# Patient Record
Sex: Female | Born: 1955 | Race: White | Hispanic: No | Marital: Married
Health system: Southern US, Community
[De-identification: ages and names within clinical notes are randomized; demographics above are authoritative.]

---

## 2001-01-30 ENCOUNTER — Other Ambulatory Visit: Admission: RE | Admit: 2001-01-30 | Discharge: 2001-01-30 | Payer: Self-pay | Admitting: *Deleted

## 2004-07-21 ENCOUNTER — Ambulatory Visit: Payer: Self-pay | Admitting: Family Medicine

## 2011-03-12 ENCOUNTER — Emergency Department: Payer: Self-pay | Admitting: Emergency Medicine

## 2012-09-01 IMAGING — CR NASAL BONES - 3+ VIEW
1 series · 3 of 3 positions shown · non-contrast
Comparison: none

REASON FOR EXAM: pain and swelling after fall/   Flex 8
COMMENTS:   LMP: Post-Menopausal

PROCEDURE:     DXR - DXR NASAL BONES  - March 12, 2011  [DATE]
RESULT:
Bilateral nasal bone fractures are identified left greater than the right.
There is mild depression on the left.

[Series 1: view not recorded · 0.17mm/px · 3 of 3 slices shown]
[im 1/3]
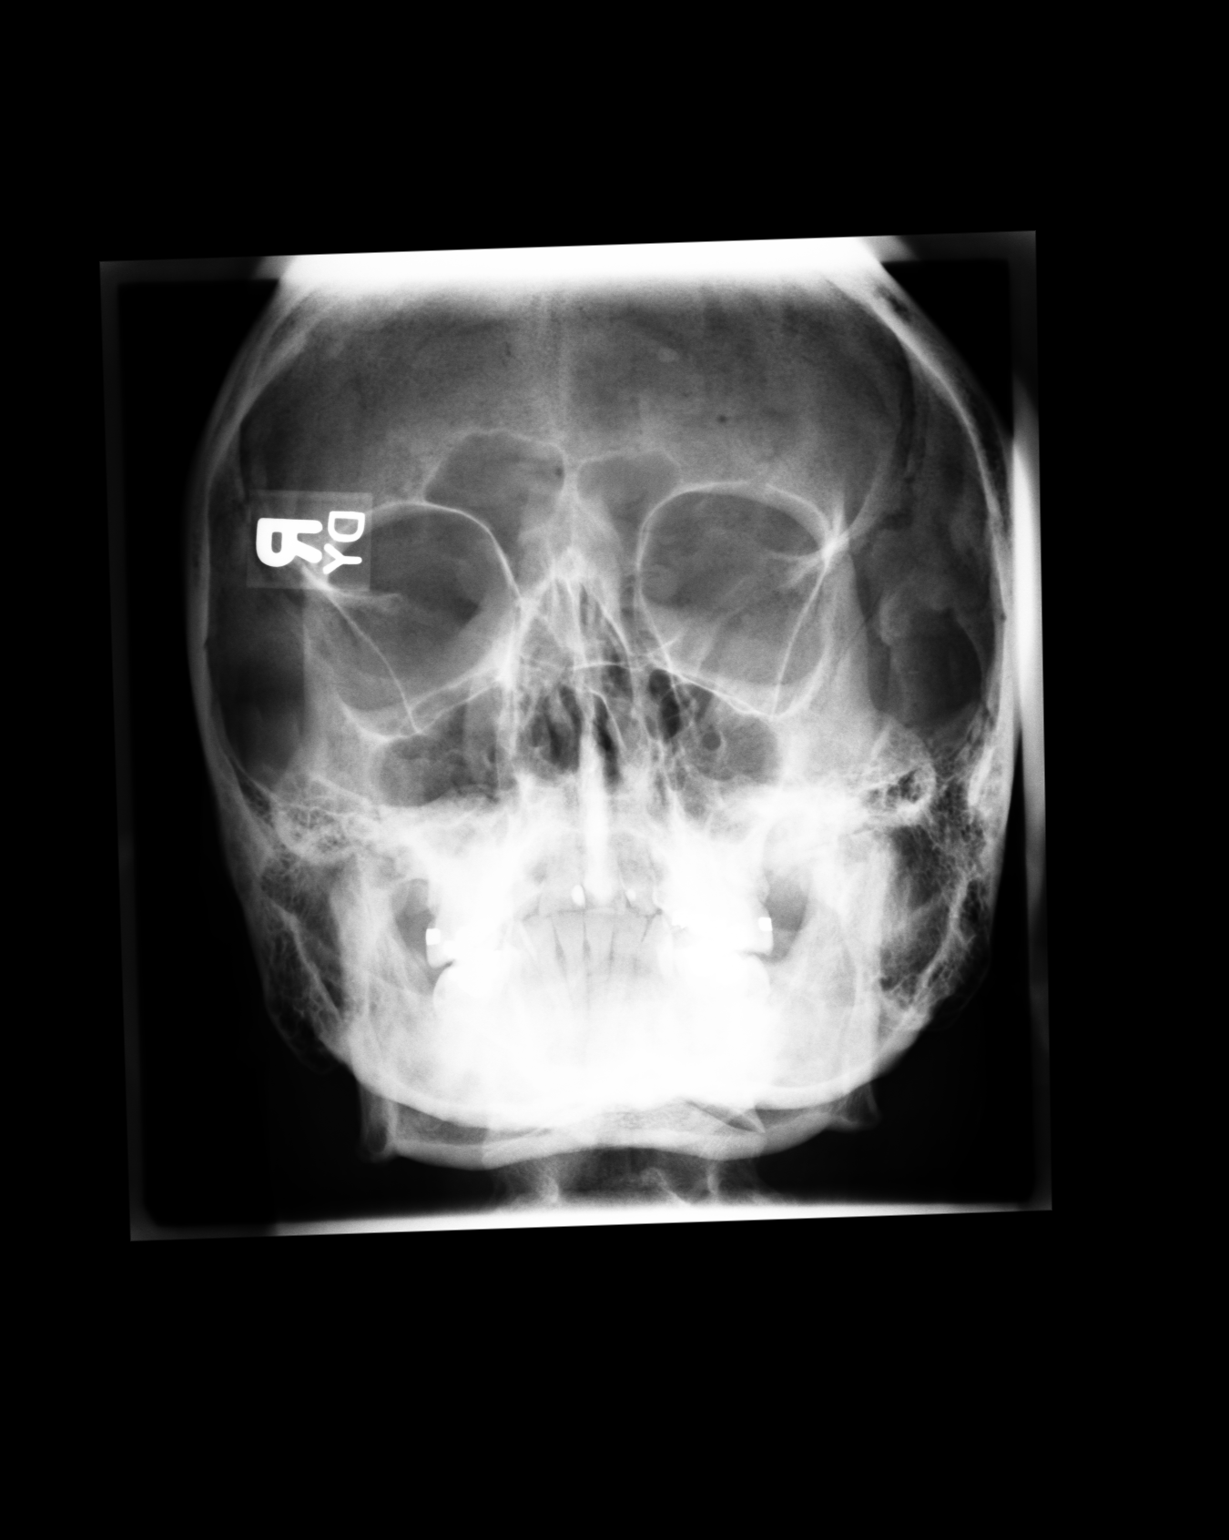
[im 2/3]
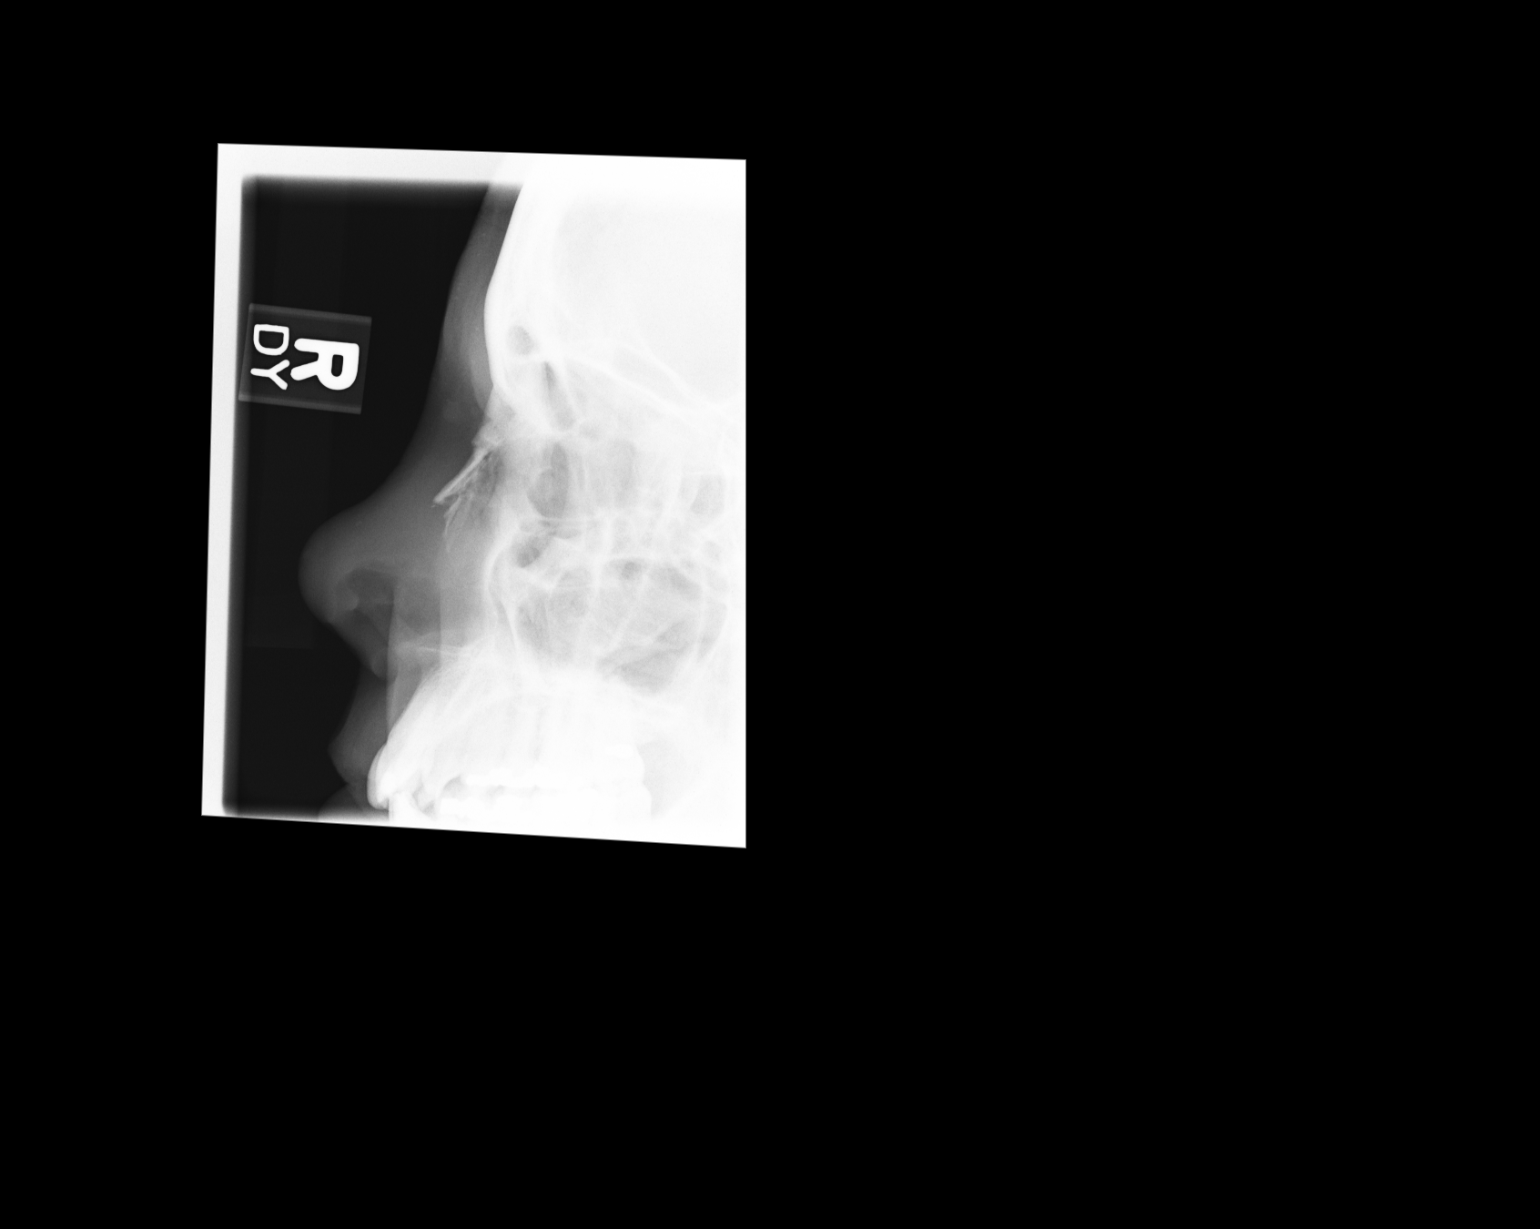
[im 3/3]
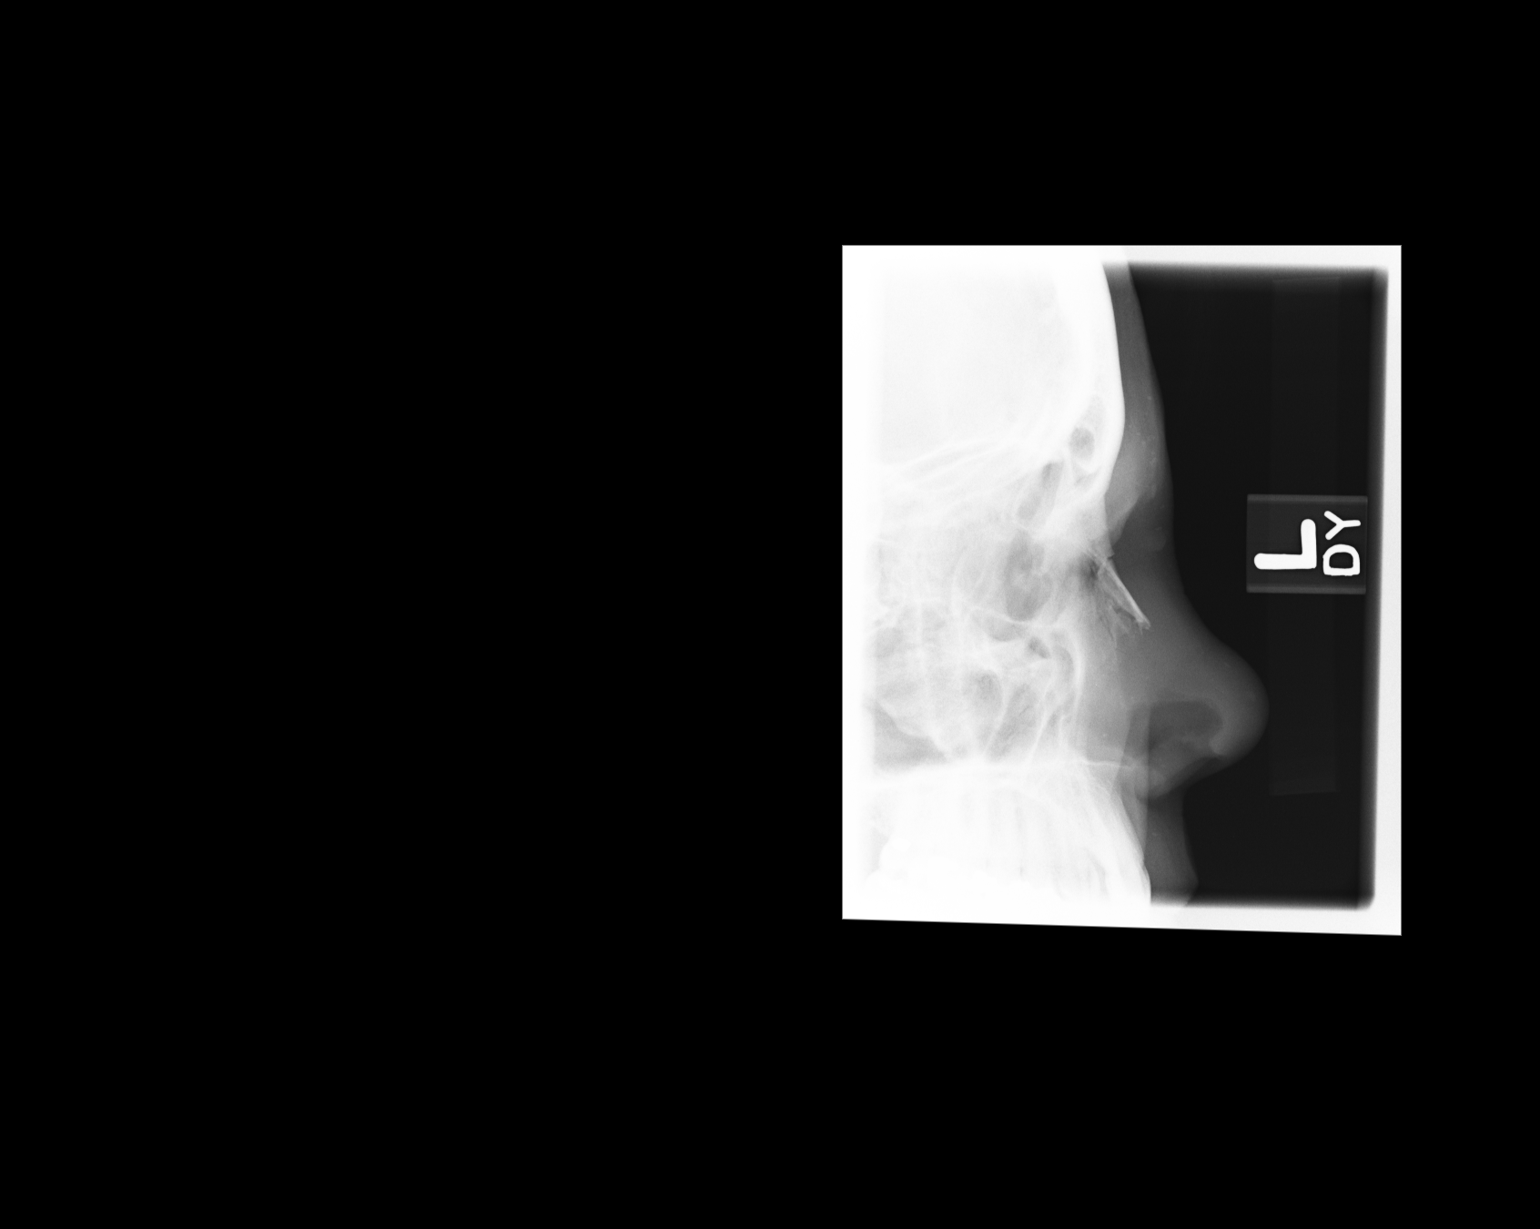

[3 of 3 positions shown; findings below may reference images not displayed]

IMPRESSION: 1. Bilateral nasal bone fractures.

## 2014-06-01 DIAGNOSIS — C4491 Basal cell carcinoma of skin, unspecified: Secondary | ICD-10-CM

## 2014-06-01 HISTORY — DX: Basal cell carcinoma of skin, unspecified: C44.91

## 2014-08-03 ENCOUNTER — Ambulatory Visit: Payer: Self-pay | Admitting: Physician Assistant

## 2014-08-03 LAB — URINALYSIS, COMPLETE
BILIRUBIN, UR: NEGATIVE
Blood: NEGATIVE
Glucose,UR: NEGATIVE
KETONE: NEGATIVE
Nitrite: NEGATIVE
Ph: 5.5 (ref 5.0–8.0)
Protein: NEGATIVE
Specific Gravity: 1.005 (ref 1.000–1.030)

## 2016-10-09 ENCOUNTER — Other Ambulatory Visit: Payer: Self-pay | Admitting: Family Medicine

## 2016-10-09 DIAGNOSIS — D239 Other benign neoplasm of skin, unspecified: Secondary | ICD-10-CM

## 2016-10-09 DIAGNOSIS — Z1231 Encounter for screening mammogram for malignant neoplasm of breast: Secondary | ICD-10-CM

## 2016-10-09 HISTORY — DX: Other benign neoplasm of skin, unspecified: D23.9

## 2017-01-29 ENCOUNTER — Ambulatory Visit
Admission: RE | Admit: 2017-01-29 | Discharge: 2017-01-29 | Disposition: A | Discharge status: Home / Self Care | Payer: BC Managed Care – PPO | Source: Ambulatory Visit | Attending: Family Medicine | Admitting: Family Medicine

## 2017-01-29 ENCOUNTER — Encounter: Payer: Self-pay | Admitting: Radiology

## 2017-01-29 DIAGNOSIS — Z1231 Encounter for screening mammogram for malignant neoplasm of breast: Secondary | ICD-10-CM

## 2020-02-18 ENCOUNTER — Encounter: Payer: BC Managed Care – PPO | Admitting: Dermatology

## 2020-05-31 ENCOUNTER — Encounter: Payer: BC Managed Care – PPO | Admitting: Dermatology

## 2020-09-23 ENCOUNTER — Other Ambulatory Visit: Payer: Self-pay

## 2020-09-23 ENCOUNTER — Encounter: Payer: Self-pay | Admitting: Dermatology

## 2020-09-23 ENCOUNTER — Ambulatory Visit: Payer: BC Managed Care – PPO | Admitting: Dermatology

## 2020-09-23 DIAGNOSIS — L570 Actinic keratosis: Secondary | ICD-10-CM | POA: Diagnosis not present

## 2020-09-23 DIAGNOSIS — L578 Other skin changes due to chronic exposure to nonionizing radiation: Secondary | ICD-10-CM | POA: Diagnosis not present

## 2020-09-23 DIAGNOSIS — D229 Melanocytic nevi, unspecified: Secondary | ICD-10-CM

## 2020-09-23 DIAGNOSIS — Z1283 Encounter for screening for malignant neoplasm of skin: Secondary | ICD-10-CM | POA: Diagnosis not present

## 2020-09-23 DIAGNOSIS — Z86018 Personal history of other benign neoplasm: Secondary | ICD-10-CM | POA: Diagnosis not present

## 2020-09-23 DIAGNOSIS — L918 Other hypertrophic disorders of the skin: Secondary | ICD-10-CM

## 2020-09-23 DIAGNOSIS — L821 Other seborrheic keratosis: Secondary | ICD-10-CM

## 2020-09-23 DIAGNOSIS — L853 Xerosis cutis: Secondary | ICD-10-CM

## 2020-09-23 DIAGNOSIS — D1800 Hemangioma unspecified site: Secondary | ICD-10-CM

## 2020-09-23 DIAGNOSIS — Z85828 Personal history of other malignant neoplasm of skin: Secondary | ICD-10-CM

## 2020-09-23 DIAGNOSIS — L814 Other melanin hyperpigmentation: Secondary | ICD-10-CM

## 2020-09-23 NOTE — Progress Notes (Signed)
Follow-Up Visit   Subjective  Heather Frey is a 65 y.o. female who presents for the following: Annual Exam (Hx BCC, dysplastic nevus, AK's ). The patient presents for Total-Body Skin Exam (TBSE) for skin cancer screening and mole check.  The following portions of the chart were reviewed this encounter and updated as appropriate:   Allergies  Meds  Problems  Med Hx  Surg Hx  Fam Hx     Review of Systems:  No other skin or systemic complaints except as noted in HPI or Assessment and Plan.  Objective  Well appearing patient in no apparent distress; mood and affect are within normal limits.  A full examination was performed including scalp, head, eyes, ears, nose, lips, neck, chest, axillae, abdomen, back, buttocks, bilateral upper extremities, bilateral lower extremities, hands, feet, fingers, toes, fingernails, and toenails. All findings within normal limits unless otherwise noted below.  Objective  L cheek x 1: Erythematous thin papules/macules with gritty scale.   Assessment & Plan  AK (actinic keratosis) L cheek x 1  Destruction of lesion - L cheek x 1 Complexity: simple   Destruction method: cryotherapy   Informed consent: discussed and consent obtained   Timeout:  patient name, date of birth, surgical site, and procedure verified Lesion destroyed using liquid nitrogen: Yes   Region frozen until ice ball extended beyond lesion: Yes   Outcome: patient tolerated procedure well with no complications   Post-procedure details: wound care instructions given    Skin cancer screening   Lentigines - Scattered tan macules - Due to sun exposure - Benign-appering, observe - Recommend daily broad spectrum sunscreen SPF 30+ to sun-exposed areas, reapply every 2 hours as needed. - Call for any changes  Seborrheic Keratoses - Stuck-on, waxy, tan-brown papules and/or plaques  - Benign-appearing - Discussed benign etiology and prognosis. - Observe - Call for any  changes  Melanocytic Nevi - Tan-brown and/or pink-flesh-colored symmetric macules and papules - Benign appearing on exam today - Observation - Call clinic for new or changing moles - Recommend daily use of broad spectrum spf 30+ sunscreen to sun-exposed areas.   Hemangiomas - Red papules - Discussed benign nature - Observe - Call for any changes  Actinic Damage - Chronic condition, secondary to cumulative UV/sun exposure - diffuse scaly erythematous macules with underlying dyspigmentation - Recommend daily broad spectrum sunscreen SPF 30+ to sun-exposed areas, reapply every 2 hours as needed.  - Staying in the shade or wearing long sleeves, sun glasses (UVA+UVB protection) and wide brim hats (4-inch brim around the entire circumference of the hat) are also recommended for sun protection.  - Call for new or changing lesions.  History of Basal Cell Carcinoma of the Skin - L upper lip med to mid nasolabial area - No evidence of recurrence today - Recommend regular full body skin exams - Recommend daily broad spectrum sunscreen SPF 30+ to sun-exposed areas, reapply every 2 hours as needed.  - Call if any new or changing lesions are noted between office visits  History of Dysplastic Nevus - R paraspinal mid to upper back - moderate - No evidence of recurrence today - Recommend regular full body skin exams - Recommend daily broad spectrum sunscreen SPF 30+ to sun-exposed areas, reapply every 2 hours as needed.  - Call if any new or changing lesions are noted between office visits  Xerosis - diffuse xerotic patches - recommend gentle, hydrating skin care - gentle skin care handout given  Acrochordons (Skin Tags) -  Fleshy, skin-colored pedunculated papules - Benign appearing.  - Observe. - If desired, they can be removed with an in office procedure that is not covered by insurance. - Please call the clinic if you notice any new or changing lesions.  Skin cancer screening  performed today.  Return in about 1 year (around 09/23/2021) for TBSE - hx BCC, dysplastic nevus, AK's.  Luther Redo, CMA, am acting as scribe for Sarina Ser, MD .  Documentation: I have reviewed the above documentation for accuracy and completeness, and I agree with the above.  Sarina Ser, MD

## 2020-09-23 NOTE — Patient Instructions (Signed)

## 2020-09-24 ENCOUNTER — Encounter: Payer: Self-pay | Admitting: Dermatology

## 2021-09-26 ENCOUNTER — Ambulatory Visit: Payer: BC Managed Care – PPO | Admitting: Dermatology

## 2021-09-26 DIAGNOSIS — L814 Other melanin hyperpigmentation: Secondary | ICD-10-CM

## 2021-09-26 DIAGNOSIS — D1800 Hemangioma unspecified site: Secondary | ICD-10-CM

## 2021-09-26 DIAGNOSIS — Z85828 Personal history of other malignant neoplasm of skin: Secondary | ICD-10-CM

## 2021-09-26 DIAGNOSIS — L918 Other hypertrophic disorders of the skin: Secondary | ICD-10-CM

## 2021-09-26 DIAGNOSIS — Z86018 Personal history of other benign neoplasm: Secondary | ICD-10-CM | POA: Diagnosis not present

## 2021-09-26 DIAGNOSIS — D229 Melanocytic nevi, unspecified: Secondary | ICD-10-CM

## 2021-09-26 DIAGNOSIS — D692 Other nonthrombocytopenic purpura: Secondary | ICD-10-CM

## 2021-09-26 DIAGNOSIS — Z1283 Encounter for screening for malignant neoplasm of skin: Secondary | ICD-10-CM

## 2021-09-26 DIAGNOSIS — L578 Other skin changes due to chronic exposure to nonionizing radiation: Secondary | ICD-10-CM

## 2021-09-26 DIAGNOSIS — L853 Xerosis cutis: Secondary | ICD-10-CM

## 2021-09-26 DIAGNOSIS — L821 Other seborrheic keratosis: Secondary | ICD-10-CM

## 2021-09-26 NOTE — Progress Notes (Signed)
? ?Follow-Up Visit ?  ?Subjective  ?Rand Heather Frey is a 66 y.o. female who presents for the following: Annual Exam (History of BCC and Dysplastic Nevus - TBSE today). ?The patient presents for Total-Body Skin Exam (TBSE) for skin cancer screening and mole check.  The patient has spots, moles and lesions to be evaluated, some may be new or changing and the patient has concerns that these could be cancer. ? ?The following portions of the chart were reviewed this encounter and updated as appropriate:  ? Allergies  Meds  Problems  Med Hx  Surg Hx  Fam Hx   ?  ?Review of Systems:  No other skin or systemic complaints except as noted in HPI or Assessment and Plan. ? ?Objective  ?Well appearing patient in no apparent distress; mood and affect are within normal limits. ? ?A full examination was performed including scalp, head, eyes, ears, nose, lips, neck, chest, axillae, abdomen, back, buttocks, bilateral upper extremities, bilateral lower extremities, hands, feet, fingers, toes, fingernails, and toenails. All findings within normal limits unless otherwise noted below. ? ? ?Assessment & Plan  ? ?History of Basal Cell Carcinoma of the Skin ?- No evidence of recurrence today ?- Recommend regular full body skin exams ?- Recommend daily broad spectrum sunscreen SPF 30+ to sun-exposed areas, reapply every 2 hours as needed.  ?- Call if any new or changing lesions are noted between office visits ? ?History of Dysplastic Nevi ?- No evidence of recurrence today ?- Recommend regular full body skin exams ?- Recommend daily broad spectrum sunscreen SPF 30+ to sun-exposed areas, reapply every 2 hours as needed.  ?- Call if any new or changing lesions are noted between office visits ? ?Acrochordons (Skin Tags) ?- Fleshy, skin-colored pedunculated papules ?- Benign appearing.  ?- Observe. ?- If desired, they can be removed with an in office procedure that is not covered by insurance. ?- Please call the clinic if you  notice any new or changing lesions. ? ?Purpura - Chronic; persistent and recurrent.  Treatable, but not curable. ?- Violaceous macules and patches ?- Benign ?- Related to trauma, age, sun damage and/or use of blood thinners, chronic use of topical and/or oral steroids ?- Observe ?- Can use OTC arnica containing moisturizer such as Dermend Bruise Formula if desired ?- Call for worsening or other concerns ? ?Lentigines ?- Scattered tan macules ?- Due to sun exposure ?- Benign-appearing, observe ?- Recommend daily broad spectrum sunscreen SPF 30+ to sun-exposed areas, reapply every 2 hours as needed. ?- Call for any changes ? ?Seborrheic Keratoses ?- Stuck-on, waxy, tan-brown papules and/or plaques  ?- Benign-appearing ?- Discussed benign etiology and prognosis. ?- Observe ?- Call for any changes ? ?Melanocytic Nevi ?- Tan-brown and/or pink-flesh-colored symmetric macules and papules ?- Benign appearing on exam today ?- Observation ?- Call clinic for new or changing moles ?- Recommend daily use of broad spectrum spf 30+ sunscreen to sun-exposed areas.  ? ?Hemangiomas ?- Red papules ?- Discussed benign nature ?- Observe ?- Call for any changes ? ?Actinic Damage ?- Chronic condition, secondary to cumulative UV/sun exposure ?- diffuse scaly erythematous macules with underlying dyspigmentation ?- Recommend daily broad spectrum sunscreen SPF 30+ to sun-exposed areas, reapply every 2 hours as needed.  ?- Staying in the shade or wearing long sleeves, sun glasses (UVA+UVB protection) and wide brim hats (4-inch brim around the entire circumference of the hat) are also recommended for sun protection.  ?- Call for new or changing lesions. ? ?Skin cancer  screening performed today. ? ?Xerosis cutis ?Legs ?Recommend moisturizer daily ?Recommend CeraVe cream. ? ?Skin cancer screening ? ?Return in about 1 year (around 09/27/2022) for TBSE. ? ?I, Ashok Cordia, CMA, am acting as scribe for Sarina Ser, MD . ?Documentation: I have  reviewed the above documentation for accuracy and completeness, and I agree with the above. ? ?Sarina Ser, MD ? ?

## 2021-09-26 NOTE — Patient Instructions (Signed)

## 2021-09-27 ENCOUNTER — Encounter: Payer: Self-pay | Admitting: Dermatology

## 2022-09-28 ENCOUNTER — Ambulatory Visit: Payer: BC Managed Care – PPO | Admitting: Dermatology

## 2023-01-24 ENCOUNTER — Ambulatory Visit: Payer: BC Managed Care – PPO | Admitting: Dermatology

## 2023-01-24 ENCOUNTER — Encounter: Payer: Self-pay | Admitting: Dermatology

## 2023-01-24 DIAGNOSIS — L570 Actinic keratosis: Secondary | ICD-10-CM | POA: Diagnosis not present

## 2023-01-24 DIAGNOSIS — L720 Epidermal cyst: Secondary | ICD-10-CM

## 2023-01-24 DIAGNOSIS — D229 Melanocytic nevi, unspecified: Secondary | ICD-10-CM

## 2023-01-24 DIAGNOSIS — Z1283 Encounter for screening for malignant neoplasm of skin: Secondary | ICD-10-CM | POA: Diagnosis not present

## 2023-01-24 DIAGNOSIS — L820 Inflamed seborrheic keratosis: Secondary | ICD-10-CM | POA: Diagnosis not present

## 2023-01-24 DIAGNOSIS — D1801 Hemangioma of skin and subcutaneous tissue: Secondary | ICD-10-CM

## 2023-01-24 DIAGNOSIS — Z85828 Personal history of other malignant neoplasm of skin: Secondary | ICD-10-CM

## 2023-01-24 DIAGNOSIS — Z86018 Personal history of other benign neoplasm: Secondary | ICD-10-CM

## 2023-01-24 DIAGNOSIS — L578 Other skin changes due to chronic exposure to nonionizing radiation: Secondary | ICD-10-CM

## 2023-01-24 DIAGNOSIS — L821 Other seborrheic keratosis: Secondary | ICD-10-CM

## 2023-01-24 DIAGNOSIS — D692 Other nonthrombocytopenic purpura: Secondary | ICD-10-CM

## 2023-01-24 DIAGNOSIS — W908XXA Exposure to other nonionizing radiation, initial encounter: Secondary | ICD-10-CM | POA: Diagnosis not present

## 2023-01-24 DIAGNOSIS — L729 Follicular cyst of the skin and subcutaneous tissue, unspecified: Secondary | ICD-10-CM

## 2023-01-24 DIAGNOSIS — L814 Other melanin hyperpigmentation: Secondary | ICD-10-CM | POA: Diagnosis not present

## 2023-01-24 NOTE — Patient Instructions (Addendum)
Cryotherapy Aftercare  Wash gently with soap and water everyday.   Apply Vaseline and Band-Aid daily until healed.    Melanoma ABCDEs  Melanoma is the most dangerous type of skin cancer, and is the leading cause of death from skin disease.  You are more likely to develop melanoma if you: Have light-colored skin, light-colored eyes, or red or blond hair Spend a lot of time in the sun Tan regularly, either outdoors or in a tanning bed Have had blistering sunburns, especially during childhood Have a close family member who has had a melanoma Have atypical moles or large birthmarks  Early detection of melanoma is key since treatment is typically straightforward and cure rates are extremely high if we catch it early.   The first sign of melanoma is often a change in a mole or a new dark spot.  The ABCDE system is a way of remembering the signs of melanoma.  A for asymmetry:  The two halves do not match. B for border:  The edges of the growth are irregular. C for color:  A mixture of colors are present instead of an even brown color. D for diameter:  Melanomas are usually (but not always) greater than 6mm - the size of a pencil eraser. E for evolution:  The spot keeps changing in size, shape, and color.  Please check your skin once per month between visits. You can use a small mirror in front and a large mirror behind you to keep an eye on the back side or your body.   If you see any new or changing lesions before your next follow-up, please call to schedule a visit.  Please continue daily skin protection including broad spectrum sunscreen SPF 30+ to sun-exposed areas, reapplying every 2 hours as needed when you're outdoors.    Due to recent changes in healthcare laws, you may see results of your pathology and/or laboratory studies on MyChart before the doctors have had a chance to review them. We understand that in some cases there may be results that are confusing or concerning to you.  Please understand that not all results are received at the same time and often the doctors may need to interpret multiple results in order to provide you with the best plan of care or course of treatment. Therefore, we ask that you please give [place] 2 business days to thoroughly review all your results before contacting the office for clarification. Should we see a critical lab result, you will be contacted sooner.   If You Need Anything After Your Visit  If you have any questions or concerns for your doctor, please call our main line at [phone] and press option 4 to reach your doctor's medical assistant. If no one answers, please leave a voicemail as directed and we will return your call as soon as possible. Messages left after 4 pm will be answered the following business day.   You may also send [place] a message via MyChart. We typically respond to MyChart messages within 1-2 business days.  For prescription refills, please ask your pharmacy to contact our office. Our fax number is [phone].  If you have an urgent issue when the clinic is closed that cannot wait until the next business day, you can page your doctor at the number below.    Please note that while we do our best to be available for urgent issues outside of office hours, we are not available 24/7.   If you have an urgent  issue and are unable to reach [place], you may choose to seek medical care at your doctor's office, retail clinic, urgent care center, or emergency room.  If you have a medical emergency, please immediately call 911 or go to the emergency department.  Pager Numbers  - Dr. Gwen Pounds: [phone]  - Dr. Roseanne Reno: [phone]  In the event of inclement weather, please call our main line at [phone] for an update on the status of any delays or closures.  Dermatology Medication Tips: Please keep the boxes that topical medications come in in order to help keep track of the instructions about where and how to use  these. Pharmacies typically print the medication instructions only on the boxes and not directly on the medication tubes.   If your medication is too expensive, please contact our office at [phone] option 4 or send [place] a message through MyChart.   We are unable to tell what your co-pay for medications will be in advance as this is different depending on your insurance coverage. However, we may be able to find a substitute medication at lower cost or fill out paperwork to get insurance to cover a needed medication.   If a prior authorization is required to get your medication covered by your insurance company, please allow [place] 1-2 business days to complete this process.  Drug prices often vary depending on where the prescription is filled and some pharmacies may offer cheaper prices.  The website www.goodrx.com contains coupons for medications through different pharmacies. The prices here do not account for what the cost may be with help from insurance (it may be cheaper with your insurance), but the website can give you the price if you did not use any insurance.  - You can print the associated coupon and take it with your prescription to the pharmacy.  - You may also stop by our office during regular business hours and pick up a GoodRx coupon card.  - If you need your prescription sent electronically to a different pharmacy, notify our office through [facility] or by phone at [phone] option 4.

## 2023-01-24 NOTE — Progress Notes (Unsigned)
Follow-Up Visit   Subjective  Heather Frey is a 67 y.o. female who presents for the following: Skin Cancer Screening and Full Body Skin Exam The patient presents for Total-Body Skin Exam (TBSE) for skin cancer screening and mole check. The patient has spots, moles and lesions to be evaluated, some may be new or changing and the patient may have concern these could be cancer.  Hx of BCC, dysplastic nevus.  The following portions of the chart were reviewed this encounter and updated as appropriate: medications, allergies, medical history  Review of Systems:  No other skin or systemic complaints except as noted in HPI or Assessment and Plan.  Objective  Well appearing patient in no apparent distress; mood and affect are within normal limits.  A full examination was performed including scalp, head, eyes, ears, nose, lips, neck, chest, axillae, abdomen, back, buttocks, bilateral upper extremities, bilateral lower extremities, hands, feet, fingers, toes, fingernails, and toenails. All findings within normal limits unless otherwise noted below.   Relevant physical exam findings are noted in the Assessment and Plan.  L temple x 1, L dorsal nose x 1, L infraorbital x 1 (3) Erythematous thin papules/macules with gritty scale.   Left Shoulder x 1 Erythematous stuck-on, waxy papule or plaque   Assessment & Plan   SKIN CANCER SCREENING PERFORMED TODAY.  ACTINIC DAMAGE - Chronic condition, secondary to cumulative UV/sun exposure - diffuse scaly erythematous macules with underlying dyspigmentation - Recommend daily broad spectrum sunscreen SPF 30+ to sun-exposed areas, reapply every 2 hours as needed.  - Staying in the shade or wearing long sleeves, sun glasses (UVA+UVB protection) and wide brim hats (4-inch brim around the entire circumference of the hat) are also recommended for sun protection.  - Call for new or changing lesions.  LENTIGINES, SEBORRHEIC KERATOSES, HEMANGIOMAS  - Benign normal skin lesions - Benign-appearing - Call for any changes  MELANOCYTIC NEVI - Tan-brown and/or pink-flesh-colored symmetric macules and papules - Benign appearing on exam today - Observation - Call clinic for new or changing moles - Recommend daily use of broad spectrum spf 30+ sunscreen to sun-exposed areas.   HISTORY OF BASAL CELL CARCINOMA OF THE SKIN - No evidence of recurrence today at L upper lip med to mid nasolabial area  - Recommend regular full body skin exams - Recommend daily broad spectrum sunscreen SPF 30+ to sun-exposed areas, reapply every 2 hours as needed.  - Call if any new or changing lesions are noted between office visits  History of Dysplastic Nevi - No evidence of recurrence today at right paraspinal mid to upper back - Recommend regular full body skin exams - Recommend daily broad spectrum sunscreen SPF 30+ to sun-exposed areas, reapply every 2 hours as needed.  - Call if any new or changing lesions are noted between office visits  AK (actinic keratosis) (3) L temple x 1, L dorsal nose x 1, L infraorbital x 1  Actinic keratoses are precancerous spots that appear secondary to cumulative UV radiation exposure/sun exposure over time. They are chronic with expected duration over 1 year. A portion of actinic keratoses will progress to squamous cell carcinoma of the skin. It is not possible to reliably predict which spots will progress to skin cancer and so treatment is recommended to prevent development of skin cancer.  Recommend daily broad spectrum sunscreen SPF 30+ to sun-exposed areas, reapply every 2 hours as needed.  Recommend staying in the shade or wearing long sleeves, sun glasses (UVA+UVB protection) and  wide brim hats (4-inch brim around the entire circumference of the hat). Call for new or changing lesions.  Patient to RTC if AK at left dorsal nose still present at 4 weeks s/p LN2 today.   Destruction of lesion - L temple x 1, L dorsal  nose x 1, L infraorbital x 1 (3) Complexity: simple   Destruction method: cryotherapy   Informed consent: discussed and consent obtained   Timeout:  patient name, date of birth, surgical site, and procedure verified Lesion destroyed using liquid nitrogen: Yes   Region frozen until ice ball extended beyond lesion: Yes   Outcome: patient tolerated procedure well with no complications   Post-procedure details: wound care instructions given    Inflamed seborrheic keratosis Left Shoulder x 1  Symptomatic, irritating, patient would like treated.  Benign-appearing.  Call clinic for new or changing lesions.    Destruction of lesion - Left Shoulder x 1 Complexity: simple   Destruction method: cryotherapy   Informed consent: discussed and consent obtained   Timeout:  patient name, date of birth, surgical site, and procedure verified Lesion destroyed using liquid nitrogen: Yes   Region frozen until ice ball extended beyond lesion: Yes   Outcome: patient tolerated procedure well with no complications   Post-procedure details: wound care instructions given    Purpura - Chronic; persistent and recurrent.  Treatable, but not curable. - Violaceous macules and patches - Benign - Related to trauma, age, sun damage and/or use of blood thinners, chronic use of topical and/or oral steroids - Observe - Can use OTC arnica containing moisturizer such as Dermend Bruise Formula if desired - Call for worsening or other concerns  EPIDERMAL INCLUSION CYST Exam: Subcutaneous nodule at right upper back  Benign-appearing. Exam most consistent with an epidermal inclusion cyst. Discussed that a cyst is a benign growth that can grow over time and sometimes get irritated or inflamed. Recommend observation if it is not bothersome. Discussed option of surgical excision to remove it if it is growing, symptomatic, or other changes noted. Please call for new or changing lesions so they can be evaluated.  Return in  about 1 year (around 01/24/2024) for TBSE, Hx BCC, Hx Dysplastic Nevi, Hx AK.  Anise Salvo, RMA, am acting as scribe for Armida Sans, MD .  Documentation: I have reviewed the above documentation for accuracy and completeness, and I agree with the above.  Armida Sans, MD

## 2023-01-25 ENCOUNTER — Encounter: Payer: Self-pay | Admitting: Dermatology

## 2024-02-13 ENCOUNTER — Ambulatory Visit: Payer: BC Managed Care – PPO | Admitting: Dermatology

## 2024-02-19 ENCOUNTER — Ambulatory Visit: Admitting: Dermatology

## 2024-03-27 ENCOUNTER — Ambulatory Visit: Admitting: Dermatology

## 2024-04-09 ENCOUNTER — Ambulatory Visit: Admitting: Dermatology

## 2024-04-09 ENCOUNTER — Encounter: Payer: Self-pay | Admitting: Dermatology

## 2024-04-09 DIAGNOSIS — Z86018 Personal history of other benign neoplasm: Secondary | ICD-10-CM

## 2024-04-09 DIAGNOSIS — L578 Other skin changes due to chronic exposure to nonionizing radiation: Secondary | ICD-10-CM | POA: Diagnosis not present

## 2024-04-09 DIAGNOSIS — L570 Actinic keratosis: Secondary | ICD-10-CM | POA: Diagnosis not present

## 2024-04-09 DIAGNOSIS — Z1283 Encounter for screening for malignant neoplasm of skin: Secondary | ICD-10-CM

## 2024-04-09 DIAGNOSIS — L729 Follicular cyst of the skin and subcutaneous tissue, unspecified: Secondary | ICD-10-CM

## 2024-04-09 DIAGNOSIS — L814 Other melanin hyperpigmentation: Secondary | ICD-10-CM | POA: Diagnosis not present

## 2024-04-09 DIAGNOSIS — W908XXA Exposure to other nonionizing radiation, initial encounter: Secondary | ICD-10-CM | POA: Diagnosis not present

## 2024-04-09 DIAGNOSIS — D229 Melanocytic nevi, unspecified: Secondary | ICD-10-CM

## 2024-04-09 DIAGNOSIS — L720 Epidermal cyst: Secondary | ICD-10-CM

## 2024-04-09 DIAGNOSIS — L821 Other seborrheic keratosis: Secondary | ICD-10-CM | POA: Diagnosis not present

## 2024-04-09 DIAGNOSIS — Z85828 Personal history of other malignant neoplasm of skin: Secondary | ICD-10-CM

## 2024-04-09 NOTE — Progress Notes (Signed)
 Follow-Up Visit   Subjective  Heather Frey is a 68 y.o. female who presents for the following: Skin Cancer Screening and Full Body Skin Exam Hx of bcc, hx of dysplastic nevi, hx of aks   The patient presents for Total-Body Skin Exam (TBSE) for skin cancer screening and mole check. The patient has spots, moles and lesions to be evaluated, some may be new or changing and the patient may have concern these could be cancer.  The following portions of the chart were reviewed this encounter and updated as appropriate: medications, allergies, medical history  Review of Systems:  No other skin or systemic complaints except as noted in HPI or Assessment and Plan.  Objective  Well appearing patient in no apparent distress; mood and affect are within normal limits.  A full examination was performed including scalp, head, eyes, ears, nose, lips, neck, chest, axillae, abdomen, back, buttocks, bilateral upper extremities, bilateral lower extremities, hands, feet, fingers, toes, fingernails, and toenails. All findings within normal limits unless otherwise noted below.   Relevant physical exam findings are noted in the Assessment and Plan.  forehead x 2 (2) Erythematous thin papules/macules with gritty scale.   Assessment & Plan   HISTORY OF BASAL CELL CARCINOMA OF THE SKIN 06/01/2014 medial to mid nasolabial area - recurred in 12/26/2018 - No evidence of recurrence today at L upper lip med to mid nasolabial area  - Recommend regular full body skin exams - Recommend daily broad spectrum sunscreen SPF 30+ to sun-exposed areas, reapply every 2 hours as needed.  - Call if any new or changing lesions are noted between office visits   History of Dysplastic Nevi 10/09/2016 right paraspinal mid to upper back - moderate  - No evidence of recurrence today at right paraspinal mid to upper back - Recommend regular full body skin exams - Recommend daily broad spectrum sunscreen SPF 30+ to  sun-exposed areas, reapply every 2 hours as needed.  - Call if any new or changing lesions are noted between office visits   SKIN CANCER SCREENING PERFORMED TODAY.  ACTINIC DAMAGE - Chronic condition, secondary to cumulative UV/sun exposure - diffuse scaly erythematous macules with underlying dyspigmentation - Recommend daily broad spectrum sunscreen SPF 30+ to sun-exposed areas, reapply every 2 hours as needed.  - Staying in the shade or wearing long sleeves, sun glasses (UVA+UVB protection) and wide brim hats (4-inch brim around the entire circumference of the hat) are also recommended for sun protection.  - Call for new or changing lesions.  LENTIGINES, SEBORRHEIC KERATOSES, HEMANGIOMAS - Benign normal skin lesions - Benign-appearing - Call for any changes  MELANOCYTIC NEVI - Tan-brown and/or pink-flesh-colored symmetric macules and papules - Benign appearing on exam today - Observation - Call clinic for new or changing moles - Recommend daily use of broad spectrum spf 30+ sunscreen to sun-exposed areas.    EPIDERMAL INCLUSION CYST Exam: Subcutaneous nodule at right upper back  Benign-appearing. Exam most consistent with an epidermal inclusion cyst. Discussed that a cyst is a benign growth that can grow over time and sometimes get irritated or inflamed. Recommend observation if it is not bothersome. Discussed option of surgical excision to remove it if it is growing, symptomatic, or other changes noted. Please call for new or changing lesions so they can be evaluated.  ACTINIC KERATOSIS (2) forehead x 2 (2) Actinic keratoses are precancerous spots that appear secondary to cumulative UV radiation exposure/sun exposure over time. They are chronic with expected duration over 1  year. A portion of actinic keratoses will progress to squamous cell carcinoma of the skin. It is not possible to reliably predict which spots will progress to skin cancer and so treatment is recommended to  prevent development of skin cancer.  Recommend daily broad spectrum sunscreen SPF 30+ to sun-exposed areas, reapply every 2 hours as needed.  Recommend staying in the shade or wearing long sleeves, sun glasses (UVA+UVB protection) and wide brim hats (4-inch brim around the entire circumference of the hat). Call for new or changing lesions. Destruction of lesion - forehead x 2 (2) Complexity: simple   Destruction method: cryotherapy   Informed consent: discussed and consent obtained   Timeout:  patient name, date of birth, surgical site, and procedure verified Lesion destroyed using liquid nitrogen: Yes   Region frozen until ice ball extended beyond lesion: Yes   Outcome: patient tolerated procedure well with no complications   Post-procedure details: wound care instructions given    Return in about 1 year (around 04/09/2025) for TBSE.  IEleanor Blush, CMA, am acting as scribe for Alm Rhyme, MD.   Documentation: I have reviewed the above documentation for accuracy and completeness, and I agree with the above.  Alm Rhyme, MD

## 2024-04-09 NOTE — Patient Instructions (Addendum)
 Cryotherapy Aftercare  Wash gently with soap and water everyday.   Apply Vaseline and Band-Aid daily until healed.   Actinic keratoses are precancerous spots that appear secondary to cumulative UV radiation exposure/sun exposure over time. They are chronic with expected duration over 1 year. A portion of actinic keratoses will progress to squamous cell carcinoma of the skin. It is not possible to reliably predict which spots will progress to skin cancer and so treatment is recommended to prevent development of skin cancer.  Recommend daily broad spectrum sunscreen SPF 30+ to sun-exposed areas, reapply every 2 hours as needed.  Recommend staying in the shade or wearing long sleeves, sun glasses (UVA+UVB protection) and wide brim hats (4-inch brim around the entire circumference of the hat). Call for new or changing lesions.    Melanoma ABCDEs  Melanoma is the most dangerous type of skin cancer, and is the leading cause of death from skin disease.  You are more likely to develop melanoma if you: Have light-colored skin, light-colored eyes, or red or blond hair Spend a lot of time in the sun Tan regularly, either outdoors or in a tanning bed Have had blistering sunburns, especially during childhood Have a close family member who has had a melanoma Have atypical moles or large birthmarks  Early detection of melanoma is key since treatment is typically straightforward and cure rates are extremely high if we catch it early.   The first sign of melanoma is often a change in a mole or a new dark spot.  The ABCDE system is a way of remembering the signs of melanoma.  A for asymmetry:  The two halves do not match. B for border:  The edges of the growth are irregular. C for color:  A mixture of colors are present instead of an even brown color. D for diameter:  Melanomas are usually (but not always) greater than 6mm - the size of a pencil eraser. E for evolution:  The spot keeps changing in size,  shape, and color.  Please check your skin once per month between visits. You can use a small mirror in front and a large mirror behind you to keep an eye on the back side or your body.   If you see any new or changing lesions before your next follow-up, please call to schedule a visit.  Please continue daily skin protection including broad spectrum sunscreen SPF 30+ to sun-exposed areas, reapplying every 2 hours as needed when you're outdoors.   Staying in the shade or wearing long sleeves, sun glasses (UVA+UVB protection) and wide brim hats (4-inch brim around the entire circumference of the hat) are also recommended for sun protection.     Due to recent changes in healthcare laws, you may see results of your pathology and/or laboratory studies on MyChart before the doctors have had a chance to review them. We understand that in some cases there may be results that are confusing or concerning to you. Please understand that not all results are received at the same time and often the doctors may need to interpret multiple results in order to provide you with the best plan of care or course of treatment. Therefore, we ask that you please give us  2 business days to thoroughly review all your results before contacting the office for clarification. Should we see a critical lab result, you will be contacted sooner.   If You Need Anything After Your Visit  If you have any questions or concerns for your  doctor, please call our main line at [phone] and press option 4 to reach your doctor's medical assistant. If no one answers, please leave a voicemail as directed and we will return your call as soon as possible. Messages left after 4 pm will be answered the following business day.   You may also send us  a message via MyChart. We typically respond to MyChart messages within 1-2 business days.  For prescription refills, please ask your pharmacy to contact our office. Our fax number is  [phone].  If you have an urgent issue when the clinic is closed that cannot wait until the next business day, you can page your doctor at the number below.    Please note that while we do our best to be available for urgent issues outside of office hours, we are not available 24/7.   If you have an urgent issue and are unable to reach us , you may choose to seek medical care at your doctor's office, retail clinic, urgent care center, or emergency room.  If you have a medical emergency, please immediately call 911 or go to the emergency department.  Pager Numbers  - Dr. Hester: [phone]  - Dr. Jackquline: [phone]  - Dr. Claudene: [phone]   - Dr. Raymund: [phone]  In the event of inclement weather, please call our main line at [phone] for an update on the status of any delays or closures.  Dermatology Medication Tips: Please keep the boxes that topical medications come in in order to help keep track of the instructions about where and how to use these. Pharmacies typically print the medication instructions only on the boxes and not directly on the medication tubes.   If your medication is too expensive, please contact our office at [phone] option 4 or send us  a message through MyChart.   We are unable to tell what your co-pay for medications will be in advance as this is different depending on your insurance coverage. However, we may be able to find a substitute medication at lower cost or fill out paperwork to get insurance to cover a needed medication.   If a prior authorization is required to get your medication covered by your insurance company, please allow us  1-2 business days to complete this process.  Drug prices often vary depending on where the prescription is filled and some pharmacies may offer cheaper prices.  The website www.goodrx.com contains coupons for medications through different pharmacies. The prices here do not account for what the cost  may be with help from insurance (it may be cheaper with your insurance), but the website can give you the price if you did not use any insurance.  - You can print the associated coupon and take it with your prescription to the pharmacy.  - You may also stop by our office during regular business hours and pick up a GoodRx coupon card.  - If you need your prescription sent electronically to a different pharmacy, notify our office through [facility] or by phone at [phone] option 4.     Si Usted Necesita Algo Despus de Su Visita  Tambin puede enviarnos un mensaje a travs de Clinical cytogeneticist. Por lo general respondemos a los mensajes de MyChart en el transcurso de 1 a 2 das hbiles.  Para renovar recetas, por favor pida a su farmacia que se ponga en contacto con nuestra oficina. Randi lakes de fax es [place] [phone].  Si tiene un asunto urgente cuando la clnica est cerrada y que  no puede esperar hasta el siguiente da hbil, puede llamar/localizar a su doctor(a) al nmero que aparece a continuacin.   Por favor, tenga en cuenta que aunque hacemos todo lo posible para estar disponibles para asuntos urgentes fuera del horario de [place], no estamos disponibles las 24 horas del da, los 7 [address]  [address] de la [place].   Si tiene un problema urgente y no puede comunicarse con nosotros, puede optar por buscar atencin mdica  en el consultorio de su doctor(a), en una clnica privada, en un centro de atencin urgente o en una sala de emergencias.  Si tiene Engineer, drilling, por favor llame inmediatamente al 911 o vaya a la sala de emergencias.  Nmeros de bper  - Dr. Hester: [phone]  - Dra. Jackquline: [phone]  - Dr. Claudene: [phone]  - Dra. Kitts: [phone]  En caso de inclemencias del [place], por favor llame a nuestra lnea principal al [phone] para una actualizacin sobre el estado de cualquier retraso o cierre.  Consejos para la medicacin en dermatologa: Por  favor, guarde las cajas en las que vienen los medicamentos de uso tpico para ayudarle a seguir las instrucciones sobre dnde y cmo usarlos. Las farmacias generalmente imprimen las instrucciones del medicamento slo en las cajas y no directamente en los tubos del [place].   Si su medicamento es muy caro, por favor, pngase en contacto con landry rieger llamando al [phone] y presione la opcin 4 o envenos un mensaje a travs de Clinical cytogeneticist.   No podemos decirle cul ser su copago por los medicamentos por adelantado ya que esto es diferente dependiendo de la cobertura de su seguro. Sin embargo, es posible que podamos encontrar un medicamento sustituto a Audiological scientist un formulario para que el seguro cubra el medicamento que se considera necesario.   Si se requiere una autorizacin previa para que su compaa de seguros malta su medicamento, por favor permtanos de 1 a 2 das hbiles para completar este proceso.  Los precios de los medicamentos varan con frecuencia dependiendo del Environmental consultant de dnde se surte la receta y alguna farmacias pueden ofrecer precios ms baratos.  El sitio web www.goodrx.com tiene cupones para medicamentos de Health and safety inspector. Los precios aqu no tienen en cuenta lo que podra costar con la ayuda del seguro (puede ser ms barato con su seguro), pero el sitio web puede darle el precio si no utiliz Tourist information centre manager.  - Puede imprimir el cupn correspondiente y llevarlo con su receta a la farmacia.  - Tambin puede pasar por nuestra oficina durante el horario de atencin regular y Education officer, museum una tarjeta de cupones de GoodRx.  - Si necesita que su receta se enve electrnicamente a una farmacia diferente, informe a nuestra oficina a travs de MyChart de [place] o por telfono llamando al [phone] y presione la opcin 4.

## 2025-04-09 ENCOUNTER — Ambulatory Visit: Admitting: Dermatology
